# Patient Record
Sex: Male | Born: 1960
Health system: Southern US, Community
[De-identification: ages and names within clinical notes are randomized; demographics above are authoritative.]

## PROBLEM LIST (undated history)

## (undated) DIAGNOSIS — T7840XA Allergy, unspecified, initial encounter: Secondary | ICD-10-CM

## (undated) DIAGNOSIS — R21 Rash and other nonspecific skin eruption: Secondary | ICD-10-CM

## (undated) HISTORY — DX: Allergy, unspecified, initial encounter: T78.40XA

## (undated) HISTORY — DX: Rash and other nonspecific skin eruption: R21

---

## 2012-12-23 ENCOUNTER — Ambulatory Visit: Payer: Self-pay | Admitting: Family Medicine

## 2012-12-23 VITALS — BP 128/74 | HR 63 | Temp 98.1°F | Resp 18 | Ht 65.5 in | Wt 139.4 lb

## 2012-12-23 DIAGNOSIS — L500 Allergic urticaria: Secondary | ICD-10-CM

## 2012-12-23 DIAGNOSIS — R21 Rash and other nonspecific skin eruption: Secondary | ICD-10-CM

## 2012-12-23 MED ORDER — METHYLPREDNISOLONE SODIUM SUCC 125 MG IJ SOLR
125.0000 mg | Freq: Once | INTRAMUSCULAR | Status: AC
  Administered 2012-12-23: 125 mg via INTRAMUSCULAR

## 2012-12-23 MED ORDER — CLOBETASOL PROPIONATE 0.05 % EX CREA: TOPICAL_CREAM | Freq: Two times a day (BID) | CUTANEOUS | Status: DC

## 2012-12-23 MED ORDER — METHYLPREDNISOLONE 4 MG PO KIT: PACK | ORAL | Status: DC

## 2012-12-23 NOTE — Progress Notes (Signed)
[facility]:  Office Visit  Chief Complaint:  Chief Complaint  Patient presents with  . Rash    rt side waist has been see here before for same thing don't know cause    HPI: Kelly Giles is a 52 y.o. male who complains of  1 week history of red itchy rash on his body, started on right waistline. No known triggers that he knows of. He has had this before. He has a history of allergy to shellfish. He ate seafood when this occurs, but it was not specifically shellfish/shrimp when this occurred. He used left over triamcinolone that he had fromlast visit for this. He has been taking allergy medication as well. He has had upper lip swelling today but then went away. He did cut the lawn about 1 week ago but rash started before this. Denies any new foods except fish base products. No new meds, no new detergents/soaps, travels. No insect bites. 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 Itchiness is worse with hot water. Deneis asthma. Denies SOB/CP/vosice changes/swallowing issues.   Past Medical History  Diagnosis Date  . Rash    History reviewed. No pertinent past surgical history. History   Social History  . Marital Status: Married    Spouse Name: N/A    Number of Children: N/A  . Years of Education: N/A   Social History Main Topics  . Smoking status: Never Smoker   . Smokeless tobacco: Never Used  . Alcohol Use: Yes     Comment: social  . Drug Use: No  . Sexually Active: Yes   Other Topics Concern  . None   Social History Narrative  . None   History reviewed. No pertinent family history. No Known Allergies Prior to Admission medications   Not on File     ROS: The patient denies fevers, chills, night sweats, unintentional weight loss, chest pain, palpitations, wheezing, dyspnea on exertion, nausea, vomiting, abdominal pain, dysuria, hematuria, melena, numbness, weakness, or tingling.  All other systems have been  reviewed and were otherwise negative with the exception of those mentioned in the HPI and as above.    PHYSICAL EXAM: Filed Vitals:   12/23/12 1835  BP: 128/74  Pulse: 63  Temp: 98.1 F (36.7 C)  Resp: 18   Filed Vitals:   12/23/12 1835  Height: 5' 5.5" (1.664 m)  Weight: 139 lb 6.4 oz (63.231 kg)   Body mass index is 22.84 kg/(m^2).  General: Alert, no acute distress HEENT:  Normocephalic, atraumatic, oropharynx patent. No exudates. Tm nl. Face is not swollen Cardiovascular:  Regular rate and rhythm, no rubs murmurs or gallops.  No Carotid bruits, radial pulse intact. No pedal edema.  Respiratory: Clear to auscultation bilaterally.  No wheezes, rales, or rhonchi.  No cyanosis, no use of accessory musculature GI: No organomegaly, abdomen is soft and non-tender, positive bowel sounds.  No masses. Skin: + urticarial rash on right waistline, + urticarial rash on legs, chest and also abdomen. Neurologic: Facial musculature symmetric. Psychiatric: Patient is appropriate throughout our interaction. Lymphatic: No cervical lymphadenopathy Musculoskeletal: Gait intact.   LABS: No results found for this or any previous visit.   EKG/XRAY:   Primary read interpreted by Dr. Conley Rolls at [facility].   ASSESSMENT/PLAN: Encounter Diagnoses  Name Primary?  . Allergic urticaria Yes  . Rash and nonspecific skin eruption    Was given Solumedrol 125 mg IM x 1 in office Rx  Medrol dose pack ( language barrier, patient was unable to understand me when trying to explain what he needed to do with PRednisone pills and how to taper so we will just do medrol pack eventhough more expensive.).  Rx Clobetasol for itchiness Take Benadryl 50 mg tonight then 25 mg every 8 hrs prn for itching or until rash resolved Use epi pen prn ( already has at home) Declines allergy testing F/u prn or go to ER prn      Dorsey Authement PHUONG, DO 12/23/2012 8:04 PM

## 2012-12-23 NOTE — Patient Instructions (Signed)
Hives Hives are itchy, red, swollen areas of the skin. They can vary in size and location on your body. Hives can come and go for hours or several days (acute hives) or for several weeks (chronic hives). Hives do not spread from person to person (noncontagious). They may get worse with scratching, exercise, and emotional stress. CAUSES   Allergic reaction to food, additives, or drugs.  Infections, including the common cold.  Illness, such as vasculitis, lupus, or thyroid disease.  Exposure to sunlight, heat, or cold.  Exercise.  Stress.  Contact with chemicals. SYMPTOMS   Red or white swollen patches on the skin. The patches may change size, shape, and location quickly and repeatedly.  Itching.  Swelling of the hands, feet, and face. This may occur if hives develop deeper in the skin. DIAGNOSIS  Your caregiver can usually tell what is wrong by performing a physical exam. Skin or blood tests may also be done to determine the cause of your hives. In some cases, the cause cannot be determined. TREATMENT  Mild cases usually get better with medicines such as antihistamines. Severe cases may require an emergency epinephrine injection. If the cause of your hives is known, treatment includes avoiding that trigger.  HOME CARE INSTRUCTIONS   Avoid causes that trigger your hives.  Take antihistamines as directed by your caregiver to reduce the severity of your hives. Non-sedating or low-sedating antihistamines are usually recommended. Do not drive while taking an antihistamine.  Take any other medicines prescribed for itching as directed by your caregiver.  Wear loose-fitting clothing.  Keep all follow-up appointments as directed by your caregiver. SEEK MEDICAL CARE IF:   You have persistent or severe itching that is not relieved with medicine.  You have painful or swollen joints. SEEK IMMEDIATE MEDICAL CARE IF:   You have a fever.  Your tongue or lips are swollen.  You have  trouble breathing or swallowing.  You feel tightness in the throat or chest.  You have abdominal pain. These problems may be the first sign of a life-threatening allergic reaction. Call your local emergency services (911 in [place] MAKE SURE YOU:   Understand these instructions.  Will watch your condition.  Will get help right away if you are not doing well or get worse. Document Released: 08/27/2005 Document Revised: 02/26/2012 Document Reviewed: 11/20/2011 Exit[employer] Patient Information 2013 [employer].  

## 2013-01-15 ENCOUNTER — Ambulatory Visit (INDEPENDENT_AMBULATORY_CARE_PROVIDER_SITE_OTHER): Payer: BC Managed Care – PPO | Admitting: Emergency Medicine

## 2013-01-15 VITALS — BP 129/70 | HR 64 | Temp 98.6°F | Resp 18 | Ht 66.0 in | Wt 141.0 lb

## 2013-01-15 DIAGNOSIS — L500 Allergic urticaria: Secondary | ICD-10-CM

## 2013-01-15 MED ORDER — PREDNISONE 10 MG PO KIT: PACK | ORAL | Status: DC

## 2013-01-15 NOTE — Progress Notes (Signed)
[facility] [address], [place] [address] [phone]  Date:  01/15/2013   Name:  Kelly Giles   DOB:  09-19-1960   MRN:  [id number]  PCP:  No PCP Per Patient    Chief Complaint: Allergies and Nasal Congestion   History of Present Illness:  Kelly Giles is a 52 y.o. very pleasant male patient who presents with the following:  Was seen on 4/15 for allergic reaction and treated with medrol dose pack and antihistamine.   Resolved for five days following completion of treatment.  Now has recurrent rash after re-exposure to shrimp.  Denies nausea or vomiting, shortness of breath, wheezing or cough.  Has recurrent pruritic rash on extremities.  No improvement with over the counter medications or other home remedies. Denies other complaint or health concern today.   There are no active problems to display for this patient.   Past Medical History  Diagnosis Date  . Rash     No past surgical history on file.  History  Substance Use Topics  . Smoking status: Never Smoker   . Smokeless tobacco: Never Used  . Alcohol Use: Yes     Comment: social    No family history on file.  No Known Allergies  Medication list has been reviewed and updated.  Current Outpatient Prescriptions on File Prior to Visit  Medication Sig Dispense Refill  . clobetasol cream (TEMOVATE) 0.05 % Apply topically 2 (two) times daily. Use only on itchy areas as needed. Do not put on face, do not use for more than 2 weeks.  60 g  1  . methylPREDNISolone (MEDROL, PAK,) 4 MG tablet follow package directions  21 tablet  0   No current facility-administered medications on file prior to visit.    Review of Systems:  As per HPI, otherwise negative.    Physical Examination: Filed Vitals:   01/15/13 2002  BP: 129/70  Pulse: 64  Temp: 98.6 F (37 C)  Resp: 18   Filed Vitals:   01/15/13 2002  Height: 5\' 6"  (1.676 m)  Weight: 141 lb (63.957 kg)   Body mass index is 22.77  kg/(m^2). Ideal Body Weight: Weight in (lb) to have BMI = 25: 154.6   GEN: WDWN, NAD, Non-toxic, Alert & Oriented x 3 HEENT: Atraumatic, Normocephalic.  Ears and Nose: No external deformity. EXTR: No clubbing/cyanosis/edema NEURO: Normal gait.  PSYCH: Normally interactive. Conversant. Not depressed or anxious appearing.  Calm demeanor.  CHEST:  Clear BS= SKIN erythematous eruption on arms.  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 TAC   Signed,  Phillips Odor, MD

## 2013-01-15 NOTE — Patient Instructions (Addendum)
Seafood Allergy   Seafood allergies are usually a life-long problem. People are usually only allergic to one seafood group. Seafood allergy does not increase the risk of iodine allergy. Some conditions (scombroid fish poisoning and [place] allergy) may seem like allergic reactions to seafood, but are separate conditions. Bad reactions may also occur after eating seafood infected or tainted by algae-derived neurotoxins (ciguatera and paralytic shellfish poisoning).  SYMPTOMS   Many allergic reactions to food are mild. Mild symptoms may be limited to hives or swelling in one area. The most dangerous symptoms are:   Breathing difficulties. This may occur from breathing in seafood allergen fumes when food is being cooked or in seafood processing factories.   A drop in blood pressure (shock).   Anaphylaxis is a severe whole body reaction. This is the most severe form of allergic reaction.  Other symptoms include:    Swelling of the face or throat.   Dizziness.   Difficulty thinking.   Intense sense of fear.   Tightness in the chest.   Vomiting.   Diarrhea.  TYPES OF SEAFOOD  There are many types of seafood. The major groups of sea life that trigger allergic reactions are:   VERTEBRATES   Scaly fish (salmon, cod, mackerel, sardines, herring, anchovies, tuna, trout, haddock, [place]).   INVERTEBRATES   Crustaceans (prawns/shrimps, lobster, crab, crayfish, yabbies).   Mollusks.   Shellfish (clams, mussels, oysters, scallops).   Cephalopods (octopus, cuttlefish, squid, calamari).   Gastropods (sea slugs, garden slugs, snails).  As a rule, patients allergic to one group of seafood can usually tolerate those from another. Seafood allergy is most common in communities where seafood is an important part of the diet, such as [place] and [place]. Sensitivity is more common in adults than children.   Occasionally, intense cooking will partially or completely destroy the triggering allergen. This may explain  why some patients allergic to fresh fish are able to tolerate salmon or tuna in a can.  AVOIDING THE ALLERGEN IS AN IMPORTANT PART OF MANAGEMENT.  Complete avoidance of one or more groups of seafood is often advised. It may be difficult to achieve in practice. Accidental exposure is more likely to occur when eating away from home. This is most true when eating at seafood restaurants.  OTHER POTENTIAL SOURCES OF ACCIDENTAL EXPOSURE AND CROSS-CONTAMINATION INCLUDE:   Seafood platters (best avoided).   Asian foods in which shellfish can be a common ingredient or contaminant (prawns in fried rice or soups).   Food may be rolled in the same batter or cooked in the same oil as seafood (take-out fish and chips).   Anchovies (fish) in Caesar salads and as an ingredient, or Worcestershire sauce.   Contaminated barbecues.   Fish extracts are also occasionally used to remove particulate matter from some beverages such as wine and beer. This process is called "fining."  SEAFOOD ALLERGY AND IODINE ALLERGY ARE UNRELATED.  Even though seafood is a rich source of natural iodine, allergic reactions to seafood proteins have a different mechanism to that of iodine. Iodine can be found in topical antiseptics and x-ray contrast agents. Patients allergic to seafood are not at an increased risk of allergic reactions to iodine. Those with iodine allergy are not at increased risk of seafood allergy.  SEEK IMMEDIATE MEDICAL CARE IF:   You have difficulty breathing, or you are wheezing or have a tight feeling in your chest or throat.   You have a swollen mouth, or have hives, swelling or   itching over your body.   You feel faint or pass out.   You develop chest pain or a worsening of the problems which originally caused you to seek medical help.  If you have eaten seafood and develop problems or symptoms that seem unusual for you, seek advice from your caregiver. If the problems are severe, call your local emergency medical  service.  Document Released: 02/16/2002 Document Revised: 11/19/2011 Document Reviewed: 04/09/2008  ExitCare Patient Information 2013 [employer].

## 2013-03-01 ENCOUNTER — Ambulatory Visit (INDEPENDENT_AMBULATORY_CARE_PROVIDER_SITE_OTHER): Payer: BC Managed Care – PPO | Admitting: Emergency Medicine

## 2013-03-01 VITALS — BP 124/84 | HR 56 | Temp 98.3°F | Resp 14 | Ht 66.0 in | Wt 142.0 lb

## 2013-03-01 DIAGNOSIS — L259 Unspecified contact dermatitis, unspecified cause: Secondary | ICD-10-CM

## 2013-03-01 DIAGNOSIS — L509 Urticaria, unspecified: Secondary | ICD-10-CM

## 2013-03-01 MED ORDER — EPINEPHRINE 0.3 MG/0.3ML IJ SOAJ: 0.3000 mg | Freq: Once | INTRAMUSCULAR | Status: AC

## 2013-03-01 MED ORDER — METHYLPREDNISOLONE SODIUM SUCC 125 MG IJ SOLR
125.0000 mg | Freq: Once | INTRAMUSCULAR | Status: AC
  Administered 2013-03-01: 125 mg via INTRAMUSCULAR

## 2013-03-01 NOTE — Patient Instructions (Signed)
Please take Claritin 10 mg one every morning. You can take 1-2 Benadryl at night. Keep your EpiPen with you. Take the prednisone 10 mg 2 tablets a day for 2 days then 1 tablet a day for 2 days then stop. You're being referred to an allergist.Hives Hives are itchy, red, swollen areas of the skin. They can vary in size and location on your body. Hives can come and go for hours or several days (acute hives) or for several weeks (chronic hives). Hives do not spread from person to person (noncontagious). They may get worse with scratching, exercise, and emotional stress. CAUSES   Allergic reaction to food, additives, or drugs.  Infections, including the common cold.  Illness, such as vasculitis, lupus, or thyroid disease.  Exposure to sunlight, heat, or cold.  Exercise.  Stress.  Contact with chemicals. SYMPTOMS   Red or white swollen patches on the skin. The patches may change size, shape, and location quickly and repeatedly.  Itching.  Swelling of the hands, feet, and face. This may occur if hives develop deeper in the skin. DIAGNOSIS  Your caregiver can usually tell what is wrong by performing a physical exam. Skin or blood tests may also be done to determine the cause of your hives. In some cases, the cause cannot be determined. TREATMENT  Mild cases usually get better with medicines such as antihistamines. Severe cases may require an emergency epinephrine injection. If the cause of your hives is known, treatment includes avoiding that trigger.  HOME CARE INSTRUCTIONS   Avoid causes that trigger your hives.  Take antihistamines as directed by your caregiver to reduce the severity of your hives. Non-sedating or low-sedating antihistamines are usually recommended. Do not drive while taking an antihistamine.  Take any other medicines prescribed for itching as directed by your caregiver.  Wear loose-fitting clothing.  Keep all follow-up appointments as directed by your  caregiver. SEEK MEDICAL CARE IF:   You have persistent or severe itching that is not relieved with medicine.  You have painful or swollen joints. SEEK IMMEDIATE MEDICAL CARE IF:   You have a fever.  Your tongue or lips are swollen.  You have trouble breathing or swallowing.  You feel tightness in the throat or chest.  You have abdominal pain. These problems may be the first sign of a life-threatening allergic reaction. Call your local emergency services (911 in [place] MAKE SURE YOU:   Understand these instructions.  Will watch your condition.  Will get help right away if you are not doing well or get worse. Document Released: 08/27/2005 Document Revised: 02/26/2012 Document Reviewed: 11/20/2011 [facility] Patient Information 2014 [place], [place].

## 2013-03-01 NOTE — Progress Notes (Signed)
  Subjective:    Patient ID: Kelly Giles, male    DOB: 02-Jul-1961, 52 y.o.   MRN: [id number]  HPI  52 year old male presents with: rash all over his body since this morning. This rash is reoccurring and started in his mid 48s.His daughter is here with him to help translate due to a language barrier. The rash is itchy. He denies any shortness of breath. He is allergic to shellfish base products. He does dye his hair- last time he dyed his hair was Tuesday 02/24/2013. He has never been to an allergist to see what he is allergic to. He does seem interested in a referral. He would like a shot to relieve the itchiness. He has taken prednisone in the past and it has helped. He has used [facility] Benadryl brand to alleviate itch. Otherwise, he has not used any OTC medication  Review of Systems     Objective:   Physical Exam his throat is clear. His neck is supple. His chest is clear there are no wheezes audible. There is diffuse urticaria over his lower abdomen groin area and upper thighs.        Assessment & Plan:  Patient has recurrent episodes of urticaria. It is unclear what all the triggers are. He did pass hair earlier this week so it could be an allergy to the dye he used. He is known to be allergic to crustaceans. Referral has been made to an allergist for testing. I refilled his EpiPen so he would have that. He is to take Claritin 10 mg one every morning.Marland Kitchen He can also take Benadryl at night. He is to take prednisone 10 mg 2 tablets a day for 2 days then 1 tablet a day for 2 days. He is going to shampoo his hair when he gets home. He is to be given 125 mg of Solu-Medrol IM.

## 2013-03-12 ENCOUNTER — Ambulatory Visit (INDEPENDENT_AMBULATORY_CARE_PROVIDER_SITE_OTHER): Payer: BC Managed Care – PPO | Admitting: Physician Assistant

## 2013-03-12 VITALS — BP 144/88 | HR 51 | Temp 98.5°F | Resp 16 | Ht 66.0 in | Wt 141.0 lb

## 2013-03-12 DIAGNOSIS — L508 Other urticaria: Secondary | ICD-10-CM

## 2013-03-12 MED ORDER — RANITIDINE HCL 150 MG PO TABS: 150.0000 mg | ORAL_TABLET | Freq: Two times a day (BID) | ORAL | Status: DC

## 2013-03-12 MED ORDER — CETIRIZINE HCL 10 MG PO TABS: 10.0000 mg | ORAL_TABLET | Freq: Every day | ORAL | Status: DC

## 2013-03-12 MED ORDER — HYDROXYZINE HCL 25 MG PO TABS: 25.0000 mg | ORAL_TABLET | Freq: Every day | ORAL | Status: DC

## 2013-03-12 NOTE — Progress Notes (Signed)
  Subjective:    Patient ID: Kelly Giles, male    DOB: 02-21-1961, 52 y.o.   MRN: [id number]  HPI   Kelly Giles is a very pleasant 52 yr old male here with concern for a rash that began yesterday.  His daughter accompanies him and helps translate as there is a language barrier.  Pt states that he has had a recurring rash for many years.  The rash only occurs on his back.  This episode began yesterday.  He attributes it to the air conditioning being too cold on the bus.  He states the rash is itchy.  There is no pain.  He has a known shellfish allergy, but denies contact with shellfish.  He denies any other new contacts.  There is no other involvement of any other areas.  Denies associated symptoms.  Of note pt was here two weeks ago for urticaria and just finished a 12 day steroid taper today.     Review of Systems  Constitutional: Negative for fever and chills.  HENT: Negative.   Respiratory: Negative for cough, shortness of breath and wheezing.   Cardiovascular: Negative.   Gastrointestinal: Negative.   Musculoskeletal: Negative.   Skin: Positive for rash.  Neurological: Negative.        Objective:   Physical Exam  Vitals reviewed. Constitutional: He is oriented to person, place, and time. He appears well-developed and well-nourished. No distress.  HENT:  Head: Normocephalic and atraumatic.  Eyes: Conjunctivae are normal. No scleral icterus.  Cardiovascular: Normal rate, regular rhythm and normal heart sounds.   Pulmonary/Chest: Effort normal and breath sounds normal. He has no wheezes. He has no rales.  Abdominal: Soft. There is no tenderness.  Neurological: He is alert and oriented to person, place, and time.  Skin: Skin is warm and dry. Rash noted. Rash is urticarial.     Multiple confluent urticarial lesions covering pt's back; no vesicles; no drainage; multiple excoriations; involvement is confined to the back  Psychiatric: He has a normal mood and affect. His behavior is normal.         Assessment & Plan:  Chronic urticaria - Plan: hydrOXYzine (ATARAX/VISTARIL) 25 MG tablet, cetirizine (ZYRTEC) 10 MG tablet, ranitidine (ZANTAC) 150 MG tablet, Ambulatory referral to Dermatology  Kelly Giles is a very pleasasnt 52 yr old male with what appears to be chronic urticaria.  On chart review, he has been seen here monthly for the last 4 months, each time treated with systemic steroids.  He has just ended a 12 day prednisone taper.  Suspect that he is now having rebound urticaria.  He denies any new exposures and does not have systemic symptoms today.  I think it is best if we avoid further steroid use as this may actually do more harm than good.  Will start him on Zyrtec QAM, ranitidine BID, and hydroxyzine QHS to hopefully control symptoms.  Cool compresses, lotions, topical benadryl, etc for relief.  Pt is scheduled to see allergy on 03/25/13.  I have also put in a referral to dermatology for further input on management.  If any symptoms are acutely worsening prior to specialty eval, pt to RTC.

## 2013-03-12 NOTE — Patient Instructions (Addendum)
Begin taking the Zyrtec (cetirizine) every morning.  Take the Atarax (hydroxyzine) every night at bed time.  Take the Zantac (ranitidine) two times daily - morning at night.  This will help control the itching.  Use topical lotion, cool compresses, topical benadryl to help with itching.  Avoid scratching if possible.  Keep your appointment with allergy this month.  I have also sent a referral to dermatology, so you should be receiving a call about scheduling this.  Please let us know if anything is worsening or not improving.

## 2013-04-19 ENCOUNTER — Ambulatory Visit (INDEPENDENT_AMBULATORY_CARE_PROVIDER_SITE_OTHER): Payer: BC Managed Care – PPO | Admitting: Emergency Medicine

## 2013-04-19 VITALS — BP 170/108 | HR 63 | Temp 98.0°F | Resp 16 | Ht 66.0 in | Wt 144.0 lb

## 2013-04-19 DIAGNOSIS — L508 Other urticaria: Secondary | ICD-10-CM

## 2013-04-19 MED ORDER — HYDROXYZINE HCL 25 MG PO TABS: 25.0000 mg | ORAL_TABLET | Freq: Every day | ORAL | Status: DC

## 2013-04-19 MED ORDER — CYPROHEPTADINE HCL 4 MG PO TABS: 4.0000 mg | ORAL_TABLET | Freq: Three times a day (TID) | ORAL | Status: DC | PRN

## 2013-04-19 NOTE — Patient Instructions (Addendum)
  Take Zyrtec daily in morning Hydroxyzine 2 at bedtime Ranitidine in morning only Cyproheptadine three times daily    Hives Hives are itchy, red, swollen areas of the skin. They can vary in size and location on your body. Hives can come and go for hours or several days (acute hives) or for several weeks (chronic hives). Hives do not spread from person to person (noncontagious). They may get worse with scratching, exercise, and emotional stress. CAUSES   Allergic reaction to food, additives, or drugs.  Infections, including the common cold.  Illness, such as vasculitis, lupus, or thyroid disease.  Exposure to sunlight, heat, or cold.  Exercise.  Stress.  Contact with chemicals. SYMPTOMS   Red or white swollen patches on the skin. The patches may change size, shape, and location quickly and repeatedly.  Itching.  Swelling of the hands, feet, and face. This may occur if hives develop deeper in the skin. DIAGNOSIS  Your caregiver can usually tell what is wrong by performing a physical exam. Skin or blood tests may also be done to determine the cause of your hives. In some cases, the cause cannot be determined. TREATMENT  Mild cases usually get better with medicines such as antihistamines. Severe cases may require an emergency epinephrine injection. If the cause of your hives is known, treatment includes avoiding that trigger.  HOME CARE INSTRUCTIONS   Avoid causes that trigger your hives.  Take antihistamines as directed by your caregiver to reduce the severity of your hives. Non-sedating or low-sedating antihistamines are usually recommended. Do not drive while taking an antihistamine.  Take any other medicines prescribed for itching as directed by your caregiver.  Wear loose-fitting clothing.  Keep all follow-up appointments as directed by your caregiver. SEEK MEDICAL CARE IF:   You have persistent or severe itching that is not relieved with medicine.  You have  painful or swollen joints. SEEK IMMEDIATE MEDICAL CARE IF:   You have a fever.  Your tongue or lips are swollen.  You have trouble breathing or swallowing.  You feel tightness in the throat or chest.  You have abdominal pain. These problems may be the first sign of a life-threatening allergic reaction. Call your local emergency services (911 in [place] MAKE SURE YOU:   Understand these instructions.  Will watch your condition.  Will get help right away if you are not doing well or get worse. Document Released: 08/27/2005 Document Revised: 02/26/2012 Document Reviewed: 11/20/2011 [facility] Patient Information 2014 [place], [place].

## 2013-04-19 NOTE — Progress Notes (Signed)
[facility] [address] [place] [address] [phone]  Date:  04/19/2013   Name:  Kelly Giles   DOB:  01-08-1961   MRN:  [id number]  PCP:  No PCP Per Patient    Chief Complaint: Follow-up  Daughter is translator  History of Present Illness:  Kelly Giles is a 52 y.o. 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  Taking medication and has transient improvement.  Now wants another prednisone pack or a shot  Awaiting dermatology appt.    Patient Active Problem List   Diagnosis Date Noted  . Hives 03/01/2013    Past Medical History  Diagnosis Date  . Rash   . Allergy     History reviewed. No pertinent past surgical history.  History  Substance Use Topics  . Smoking status: Never Smoker   . Smokeless tobacco: Never Used  . Alcohol Use: Yes     Comment: social    History reviewed. No pertinent family history.  Allergies  Allergen Reactions  . Shellfish Allergy     Medication list has been reviewed and updated.  Current Outpatient Prescriptions on File Prior to Visit  Medication Sig Dispense Refill  . cetirizine (ZYRTEC) 10 MG tablet Take 1 tablet (10 mg total) by mouth daily.  30 tablet  11  . EPINEPHrine (EPI-PEN) 0.3 mg/0.3 mL DEVI Inject 0.3 mLs (0.3 mg total) into the muscle once.  1 Device  2  . hydrOXYzine (ATARAX/VISTARIL) 25 MG tablet Take 1 tablet (25 mg total) by mouth at bedtime.  30 tablet  11  . ranitidine (ZANTAC) 150 MG tablet Take 1 tablet (150 mg total) by mouth 2 (two) times daily.  60 tablet  11  . clobetasol cream (TEMOVATE) 0.05 % Apply topically 2 (two) times daily. Use only on itchy areas as needed. Do not put on face, do not use for more than 2 weeks.  60 g  1   No current facility-administered medications on file prior to visit.    Review of Systems:  As per HPI, otherwise negative.    Physical  Examination: Filed Vitals:   04/19/13 1109  BP: 170/108  Pulse: 63  Temp: 98 F (36.7 C)  Resp: 16   Filed Vitals:   04/19/13 1109  Height: 5\' 6"  (1.676 m)  Weight: 144 lb (65.318 kg)   Body mass index is 23.25 kg/(m^2). Ideal Body Weight: Weight in (lb) to have BMI = 25: 154.6  GEN: WDWN, NAD, Non-toxic, A & O x 3 HEENT: Atraumatic, Normocephalic. Neck supple. No masses, No LAD. Ears and Nose: No external deformity. CV: RRR, No M/G/R. No JVD. No thrill. No extra heart sounds. PULM: CTA B, no wheezes, crackles, rhonchi. No retractions. No resp. distress. No accessory muscle use. ABD: S, NT, ND, +BS. No rebound. No HSM. EXTR: No c/c/e NEURO Normal gait.  PSYCH: Normally interactive.  Not depressed or anxious appearing.  Calm demeanor.  SKIN;  urticaria  Assessment and Plan: Urticaria  Signed,  Phillips Odor, MD

## 2017-01-31 ENCOUNTER — Ambulatory Visit (INDEPENDENT_AMBULATORY_CARE_PROVIDER_SITE_OTHER): Payer: BLUE CROSS/BLUE SHIELD | Admitting: Physician Assistant

## 2017-01-31 ENCOUNTER — Ambulatory Visit (INDEPENDENT_AMBULATORY_CARE_PROVIDER_SITE_OTHER): Payer: BLUE CROSS/BLUE SHIELD

## 2017-01-31 ENCOUNTER — Encounter: Payer: Self-pay | Admitting: Physician Assistant

## 2017-01-31 ENCOUNTER — Encounter: Payer: Self-pay | Admitting: Family Medicine

## 2017-01-31 VITALS — BP 160/74 | HR 53 | Temp 97.8°F | Resp 18 | Ht 65.0 in | Wt 142.0 lb

## 2017-01-31 DIAGNOSIS — I10 Essential (primary) hypertension: Secondary | ICD-10-CM

## 2017-01-31 DIAGNOSIS — Z201 Contact with and (suspected) exposure to tuberculosis: Secondary | ICD-10-CM | POA: Diagnosis not present

## 2017-01-31 DIAGNOSIS — R911 Solitary pulmonary nodule: Secondary | ICD-10-CM | POA: Diagnosis not present

## 2017-01-31 MED ORDER — AMLODIPINE BESYLATE 5 MG PO TABS: 5.0000 mg | ORAL_TABLET | Freq: Every day | ORAL | 3 refills | Status: AC

## 2017-01-31 NOTE — Patient Instructions (Addendum)
Please sign a consent for release of information so that we can request the records from your previous provider.  Please schedule a wellness visit and establish with a provider for primary care at your convenience.  The imaging facility will contact you to schedule the chest CT.    IF you received an x-ray today, you will receive an invoice from [facility]. Please contact [facility] at [phone] with questions or concerns regarding your invoice.   IF you received labwork today, you will receive an invoice from [facility]. Please contact [facility] at [phone] with questions or concerns regarding your invoice.   Our billing staff will not be able to assist you with questions regarding bills from these companies.  You will be contacted with the lab results as soon as they are available. The fastest way to get your results is to activate your My Chart account. Instructions are located on the last page of this paperwork. If you have not heard from us regarding the results in 2 weeks, please contact this office.

## 2017-01-31 NOTE — Progress Notes (Signed)
Patient ID: Kelly Giles, male     DOB: 1960/09/21, 56 y.o.    MRN: [id number]  PCP: Patient, No Pcp Per  Chief Complaint  Patient presents with  . Annual Exam    CXR, Possible TB (Pt's Wife has TB) no symptoms    Subjective:   This patient is new to me and presents for evaluation of TB exposure.  His wife was diagnosed 2-3 months ago, during evaluation of a cough. She has been on TB treatment through the [facility] x 2 months. He was advised to seek evaluation as well, as he is a household/intimate contact.  He is asymptomatic. No cough, fever, night sweats, weight loss.   Review of Systems No chest pain, SOB, HA, dizziness, vision change, N/V, diarrhea, constipation, dysuria, urinary urgency or frequency, myalgias, arthralgias or rash.   Prior to Admission medications   Medication Sig Start Date End Date Taking? Authorizing Provider  EPINEPHrine (EPI-PEN) 0.3 mg/0.3 mL DEVI Inject 0.3 mLs (0.3 mg total) into the muscle once. Patient not taking: Reported on 01/31/2017 03/01/13   Collene Gobbleaub, Steven A, MD  hydrOXYzine (ATARAX/VISTARIL) 25 MG tablet  12/02/16   [provider]     Allergies  Allergen Reactions  . Shellfish Allergy      Patient Active Problem List   Diagnosis Date Noted  . Hives 03/01/2013     No family history on file.   Social History   Social History  . Marital status: Married    Spouse name: Myriam JacobsonHelen  . Number of children: 2  . Years of education: 3rd grade   Occupational History  . Curatormechanic     heavy engines   Social History Main Topics  . Smoking status: Never Smoker  . Smokeless tobacco: Never Used  . Alcohol use Yes     Comment: social  . Drug use: No  . Sexual activity: Yes    Partners: Female   Other Topics Concern  . Not on file   Social History Narrative   From [place]. Came to the [place] in 1993.   Lives with his wife and their daughter, who attends [employer].   Son lives in [place].         Objective:  Physical Exam    Constitutional: He is oriented to person, place, and time. He appears well-developed and well-nourished. He is active and cooperative. No distress.  BP (!) 160/74   Pulse (!) 53   Temp 97.8 F (36.6 C) (Oral)   Resp 18   Ht 5\' 5"  (1.651 m)   Wt 142 lb (64.4 kg)   SpO2 98%   BMI 23.63 kg/m   HENT:  Head: Normocephalic and atraumatic.  Right Ear: Hearing normal.  Left Ear: Hearing normal.  Eyes: Conjunctivae are normal. No scleral icterus.  Neck: Normal range of motion. Neck supple. No thyromegaly present.  Cardiovascular: Normal rate, regular rhythm and normal heart sounds.   Pulses:      Radial pulses are 2+ on the right side, and 2+ on the left side.  Pulmonary/Chest: Effort normal and breath sounds normal.  Lymphadenopathy:       Head (right side): No tonsillar, no preauricular, no posterior auricular and no occipital adenopathy present.       Head (left side): No tonsillar, no preauricular, no posterior auricular and no occipital adenopathy present.    He has no cervical adenopathy.       Right: No supraclavicular adenopathy present.  Left: No supraclavicular adenopathy present.  Neurological: He is alert and oriented to person, place, and time. No sensory deficit.  Skin: Skin is warm, dry and intact. No rash noted. No cyanosis or erythema. Nails show no clubbing.  Psychiatric: He has a normal mood and affect. His speech is normal and behavior is normal.    Dg Chest 2 View  Result Date: 01/31/2017 CLINICAL DATA:  Evaluate possible left lower lobe pulmonary nodule. EXAM: CHEST  2 VIEW COMPARISON:  Earlier film, same date. FINDINGS: The repeat chest film with nipple markers demonstrates that the left lower lobe pulmonarya nodule is not the left nipple. Recommend chest CT with contrast for further evaluation. IMPRESSION: Left lower lobe pulmonary nodule. Recommend chest CT for further evaluation. Electronically Signed   By: Rudie Meyer M.D.   On: 01/31/2017 10:18   Dg  Chest 2 View  Result Date: 01/31/2017 CLINICAL DATA:  Exposure to TB. EXAM: CHEST  2 VIEW COMPARISON:  None. FINDINGS: No pneumothorax. The heart, hila, and mediastinum are normal. The right lung is clear. A nodule projected over the lateral left lung base could represent a nipple shadow. No other acute abnormalities are seen within the chest. IMPRESSION: A nodule projected over the lateral left lung base could represent a nipple shadow. Recommend repeat imaging with nipple markers. Electronically Signed   By: Gerome Sam III M.D   On: 01/31/2017 09:54       Assessment & Plan:   Problem List Items Addressed This Visit    Lung nodule   Relevant Orders   CT Chest W Contrast   Exposure to TB - Primary   Relevant Orders   DG Chest 2 View (Completed)   TB Skin Test (Completed)   DG Chest 2 View (Completed)   Care order/instruction: (Completed)   Care order/instruction: (Completed)   CT Chest W Contrast   Benign essential HTN    Uncontrolled off medication. Will request previous records. Start amlodipine 5 mg.      Relevant Medications   amLODipine (NORVASC) 5 MG tablet       Return in about 6 weeks (around 03/14/2017) for for BP recheck and wellness visit, return in 48-72 hours for the skin test reading.   Fernande Bras, PA-C Primary Care at [facility]

## 2017-01-31 NOTE — Assessment & Plan Note (Addendum)
Uncontrolled off medication. Will request previous records. Start amlodipine 5 mg.

## 2017-01-31 NOTE — Plan of Care (Signed)
  Tuberculosis Risk Questionnaire  1. Yes  Were you born outside the [place] in one of the following parts of the world: [place], [place], [place], [place] or [place]?    2. Yes  Have you traveled outside the [place] and lived for more than one month in one of the following parts of the world: [place], [place], [place], [place] or [place]?    3. No Do you have a compromised immune system such as from any of the following conditions:HIV/AIDS, organ or bone marrow transplantation, diabetes, immunosuppressive medicines (e.g. Prednisone, Remicaide), leukemia, lymphoma, cancer of the head or neck, gastrectomy or jejunal bypass, end-stage renal disease (on dialysis), or silicosis?     4. No Have you ever or do you plan on working in: a residential care center, a health care facility, a jail or prison or homeless shelter?    5. No Have you ever: injected illegal drugs, used crack cocaine, lived in a homeless shelter  or been in jail or prison?     6. Yes  Have you ever been exposed to anyone with infectious tuberculosis?    Tuberculosis Symptom Questionnaire  Do you currently have any of the following symptoms?  1. No Unexplained cough lasting more than 3 weeks?   2. No Unexplained fever lasting more than 3 weeks.   3. No Night Sweats (sweating that leaves the bedclothes and sheets wet)     4. No Shortness of Breath   5. No Chest Pain   6. No Unintentional weight loss    7. No Unexplained fatigue (very tired for no reason)

## 2017-02-02 ENCOUNTER — Encounter: Payer: Self-pay | Admitting: Family Medicine

## 2017-02-02 ENCOUNTER — Ambulatory Visit (INDEPENDENT_AMBULATORY_CARE_PROVIDER_SITE_OTHER): Payer: BLUE CROSS/BLUE SHIELD | Admitting: Family Medicine

## 2017-02-02 VITALS — BP 144/88 | HR 73 | Temp 98.8°F | Resp 14 | Ht 65.0 in | Wt 142.0 lb

## 2017-02-02 DIAGNOSIS — R7611 Nonspecific reaction to tuberculin skin test without active tuberculosis: Secondary | ICD-10-CM | POA: Diagnosis not present

## 2017-02-02 DIAGNOSIS — Z201 Contact with and (suspected) exposure to tuberculosis: Secondary | ICD-10-CM | POA: Diagnosis not present

## 2017-02-02 DIAGNOSIS — Z5181 Encounter for therapeutic drug level monitoring: Secondary | ICD-10-CM | POA: Diagnosis not present

## 2017-02-02 LAB — POCT CBC
Granulocyte percent: 58.7 %G (ref 37–80)
HCT, POC: 46 % (ref 43.5–53.7)
Hemoglobin: 15.6 g/dL (ref 14.1–18.1)
Lymph, poc: 2 (ref 0.6–3.4)
MCH, POC: 26.5 pg — AB (ref 27–31.2)
MCHC: 33.9 g/dL (ref 31.8–35.4)
MCV: 78 fL — AB (ref 80–97)
MID (cbc): 0.1 (ref 0–0.9)
MPV: 8.7 fL (ref 0–99.8)
PLATELET COUNT, POC: 203 10*3/uL (ref 142–424)
POC Granulocyte: 3.1 (ref 2–6.9)
POC LYMPH %: 38.6 % (ref 10–50)
POC MID %: 2.7 %M (ref 0–12)
RBC: 5.89 M/uL (ref 4.69–6.13)
RDW, POC: 13 %
WBC: 5.3 10*3/uL (ref 4.6–10.2)

## 2017-02-02 LAB — TB SKIN TEST: TB Skin Test: POSITIVE

## 2017-02-02 NOTE — Progress Notes (Signed)
Chief Complaint  Patient presents with  . PPD Reading    has induration +PPD    HPI   Patients daughter is translating over the phone He does not understand if he has tb or not He states that his wife is being treated at the health department.  He [place] of the [place]  His daughter was tested and was negative They did not want to get any treatment unless the test was confirmed.  He denies cough He denies fevers or night sweats.    Past Medical History:  Diagnosis Date  . Allergy   . Rash     Current Outpatient Prescriptions  Medication Sig Dispense Refill  . amLODipine (NORVASC) 5 MG tablet Take 1 tablet (5 mg total) by mouth daily. 90 tablet 3  . hydrOXYzine (ATARAX/VISTARIL) 25 MG tablet   0  . EPINEPHrine (EPI-PEN) 0.3 mg/0.3 mL DEVI Inject 0.3 mLs (0.3 mg total) into the muscle once. (Patient not taking: Reported on 01/31/2017) 1 Device 2   No current facility-administered medications for this visit.     Allergies:  Allergies  Allergen Reactions  . Shellfish Allergy     No past surgical history on file.  Social History   Social History  . Marital status: Married    Spouse name: Kelly Giles  . Number of children: 2  . Years of education: 3rd grade   Occupational History  . Curatormechanic     heavy engines   Social History Main Topics  . Smoking status: Never Smoker  . Smokeless tobacco: Never Used  . Alcohol use Yes     Comment: social  . Drug use: No  . Sexual activity: Yes    Partners: Female   Other Topics Concern  . None   Social History Narrative   From [place]. Came [place] the [place] in [place].   Lives with his wife and their daughter, who attends [employer].   Son lives in [place].    ROS See hpi  Objective: Vitals:   02/02/17 0940  BP: (!) 144/88  Pulse: 73  Resp: 14  Temp: 98.8 F (37.1 C)  Weight: 142 lb (64.4 kg)  Height: 5\' 5"  (1.651 m)    Physical Exam  Constitutional: He appears well-developed and well-nourished.  Cardiovascular: Normal  rate, regular rhythm and normal heart sounds.   Pulmonary/Chest: Effort normal and breath sounds normal. No respiratory distress. He has no wheezes.  Skin: Skin is warm.  Induration 30mm    Assessment and Plan Kelly Giles was seen today for ppd reading.  Diagnoses and all orders for this visit:  Exposure to TB -     Quantiferon tb gold assay -     POCT CBC -     Comprehensive metabolic panel -     HIV antibody -     Ambulatory referral to Infectious Disease  Positive PPD -     POCT CBC -     Comprehensive metabolic panel -     Ambulatory referral to Infectious Disease  Medication monitoring encounter -     Comprehensive metabolic panel  Spent time speaking to patient in office, his wife on the phone and his daughter. Discussed the patient with Dr. Gwendolyn GrantWalden. Discussed referral to Health Dept or Infectious Disease Will check for risk factors as well as check liver enzymes so that if treatment is necessary it can be initiated  Will check quantiferon gold since pt may have a false positive  A total of 25 minutes were  spent face-to-face with the patient during this encounter and over half of that time was spent on counseling and coordination of care.   Kelly Giles

## 2017-02-03 LAB — COMPREHENSIVE METABOLIC PANEL
ALBUMIN: 4.4 g/dL (ref 3.5–5.5)
ALK PHOS: 79 IU/L (ref 39–117)
ALT: 25 IU/L (ref 0–44)
AST: 18 IU/L (ref 0–40)
Albumin/Globulin Ratio: 1.5 (ref 1.2–2.2)
BUN / CREAT RATIO: 15 (ref 9–20)
BUN: 13 mg/dL (ref 6–24)
Bilirubin Total: 0.6 mg/dL (ref 0.0–1.2)
CO2: 22 mmol/L (ref 18–29)
CREATININE: 0.86 mg/dL (ref 0.76–1.27)
Calcium: 9.7 mg/dL (ref 8.7–10.2)
Chloride: 101 mmol/L (ref 96–106)
GFR calc Af Amer: 114 mL/min/{1.73_m2} (ref >59)
GFR calc non Af Amer: 99 mL/min/{1.73_m2} (ref >59)
GLUCOSE: 130 mg/dL — AB (ref 65–99)
Globulin, Total: 3 g/dL (ref 1.5–4.5)
Potassium: 4.5 mmol/L (ref 3.5–5.2)
Sodium: 140 mmol/L (ref 134–144)
Total Protein: 7.4 g/dL (ref 6.0–8.5)

## 2017-02-03 LAB — HIV ANTIBODY (ROUTINE TESTING W REFLEX): HIV SCREEN 4TH GENERATION: NONREACTIVE

## 2017-02-06 ENCOUNTER — Encounter: Payer: Self-pay | Admitting: Family Medicine

## 2017-02-06 ENCOUNTER — Telehealth: Payer: Self-pay | Admitting: Family Medicine

## 2017-02-06 LAB — QUANTIFERON TB GOLD ASSAY (BLOOD)

## 2017-02-06 LAB — QUANTIFERON IN TUBE
QFT TB AG MINUS NIL VALUE: 7.5 IU/mL
QUANTIFERON MITOGEN VALUE: 6.94 [IU]/mL
QUANTIFERON TB AG VALUE: 7.64 [IU]/mL
QUANTIFERON TB GOLD: POSITIVE — AB
Quantiferon Nil Value: 0.14 IU/mL

## 2017-02-06 NOTE — Telephone Encounter (Signed)
Spoke to [employer] about patient's positive quantiferon gold and current labs She already has plan to bring records to the health department.

## 2017-02-08 ENCOUNTER — Telehealth: Payer: Self-pay | Admitting: Physician Assistant

## 2017-02-08 NOTE — Telephone Encounter (Signed)
PATIENT'S WIFE (HUI) SAID SOMEONE TRIED TO CALL HER CELL PHONE TODAY (02/08/17) ABOUT 2:15 pm BUT THEY DID NOT LEAVE A MESSAGE. SHE IS RETURNING OUR CALL. [facility] [phone] (CELL) PATIENT'S WIFE IS HUI AND SHE IS ON HIS 2018 HIPAA. HE DOES NOT SPEAK ENGLISH.  MBC

## 2017-02-09 NOTE — Telephone Encounter (Signed)
Wife has already seen in my chart and confirms she will let health dept. know

## 2017-02-11 ENCOUNTER — Other Ambulatory Visit: Payer: Self-pay | Admitting: Physician Assistant

## 2017-02-14 ENCOUNTER — Inpatient Hospital Stay: Admission: RE | Admit: 2017-02-14 | Payer: Self-pay | Source: Ambulatory Visit

## 2017-02-19 ENCOUNTER — Ambulatory Visit
Admission: RE | Admit: 2017-02-19 | Discharge: 2017-02-19 | Disposition: A | Discharge status: Home / Self Care | Payer: BLUE CROSS/BLUE SHIELD | Source: Ambulatory Visit | Attending: Physician Assistant | Admitting: Physician Assistant

## 2017-02-19 DIAGNOSIS — R911 Solitary pulmonary nodule: Secondary | ICD-10-CM

## 2017-02-19 DIAGNOSIS — Z201 Contact with and (suspected) exposure to tuberculosis: Secondary | ICD-10-CM

## 2017-02-19 MED ORDER — IOPAMIDOL (ISOVUE-300) INJECTION 61%
75.0000 mL | Freq: Once | INTRAVENOUS | Status: AC | PRN
  Administered 2017-02-19: 75 mL via INTRAVENOUS

## 2017-09-11 ENCOUNTER — Ambulatory Visit: Payer: BLUE CROSS/BLUE SHIELD | Admitting: Physician Assistant

## 2017-09-13 ENCOUNTER — Encounter: Payer: Self-pay | Admitting: Physician Assistant

## 2017-09-13 ENCOUNTER — Ambulatory Visit: Payer: BLUE CROSS/BLUE SHIELD | Admitting: Physician Assistant

## 2017-09-13 ENCOUNTER — Telehealth: Payer: Self-pay | Admitting: Physician Assistant

## 2017-09-13 ENCOUNTER — Other Ambulatory Visit: Payer: Self-pay

## 2017-09-13 VITALS — BP 118/70 | HR 61 | Temp 98.6°F | Resp 18 | Ht 65.0 in | Wt 144.4 lb

## 2017-09-13 DIAGNOSIS — R911 Solitary pulmonary nodule: Secondary | ICD-10-CM

## 2017-09-13 DIAGNOSIS — Z1159 Encounter for screening for other viral diseases: Secondary | ICD-10-CM

## 2017-09-13 DIAGNOSIS — I700 Atherosclerosis of aorta: Secondary | ICD-10-CM | POA: Diagnosis not present

## 2017-09-13 DIAGNOSIS — Z23 Encounter for immunization: Secondary | ICD-10-CM | POA: Diagnosis not present

## 2017-09-13 DIAGNOSIS — K760 Fatty (change of) liver, not elsewhere classified: Secondary | ICD-10-CM | POA: Diagnosis not present

## 2017-09-13 NOTE — Assessment & Plan Note (Signed)
Update non-contract CT scan as recommended.

## 2017-09-13 NOTE — Telephone Encounter (Signed)
Pt CT CHEST WO CONTRAST was denied through insurance.. You can do a peer to peer to add additional clinical information at [phone]..  Thank you.

## 2017-09-13 NOTE — Patient Instructions (Signed)
     IF you received an x-ray today, you will receive an invoice from [facility]. Please contact [facility] at [phone] with questions or concerns regarding your invoice.   IF you received labwork today, you will receive an invoice from [facility]. Please contact [facility] at [phone] with questions or concerns regarding your invoice.   Our billing staff will not be able to assist you with questions regarding bills from these companies.  You will be contacted with the lab results as soon as they are available. The fastest way to get your results is to activate your My Chart account. Instructions are located on the last page of this paperwork. If you have not heard from us regarding the results in 2 weeks, please contact this office.     

## 2017-09-13 NOTE — Assessment & Plan Note (Signed)
Lipids today. BP is well controlled on current treatment with amlodipine.

## 2017-09-13 NOTE — Progress Notes (Signed)
   Patient ID: Rhythm Y Banker, male    DOB: 11/13/1960, 56 y.o.   MRN: [id number]  PCP: Patient, No Pcp Per  Chief Complaint  Patient presents with  . Follow-up    on TB     Subjective:   Presents for evaluation of lung nodule. He is accompanied by his wife and their daughter, who translates.  I met him in 01/2017 for evaluation after his wife was diagnosed with TB. CXR at that visit revealed a nodule in the LEFT lower lobe. CT scan revealed an 8 mm LEFT lower lobe nodule, and repeat imaging with non-contrast CT recommended in 6-12 months.  He has completed treatment for TB exposure. He feels well, and has no symptoms of active TB.  Review of Systems No chest pain, SOB, HA, dizziness, vision change, N/V, diarrhea, constipation, dysuria, urinary urgency or frequency, myalgias, arthralgias or rash. No melena/hematochezia.   Patient Active Problem List   Diagnosis Date Noted  . Atherosclerosis of aorta (HCC) 09/13/2017  . Hepatic steatosis 09/13/2017  . Lung nodule 01/31/2017  . Exposure to TB 01/31/2017  . Benign essential HTN 01/31/2017  . Hives 03/01/2013     Prior to Admission medications   Medication Sig Start Date End Date Taking? Authorizing Provider  amLODipine (NORVASC) 5 MG tablet Take 1 tablet (5 mg total) by mouth daily. 01/31/17  Yes Jeffery, Chelle, PA-C  EPINEPHrine (EPI-PEN) 0.3 mg/0.3 mL DEVI Inject 0.3 mLs (0.3 mg total) into the muscle once. 03/01/13  Yes Daub, Steven A, MD  hydrOXYzine (ATARAX/VISTARIL) 25 MG tablet  12/02/16  Yes [provider]     Allergies  Allergen Reactions  . Shellfish Allergy        Objective:  Physical Exam  Constitutional: He is oriented to person, place, and time. He appears well-developed and well-nourished. He is active and cooperative. No distress.  BP 118/70   Pulse 61   Temp 98.6 F (37 C) (Oral)   Resp 18   Ht 5' 5" (1.651 m)   Wt 144 lb 6.4 oz (65.5 kg)   SpO2 98%   BMI 24.03 kg/m   HENT:  Head:  Normocephalic and atraumatic.  Right Ear: Hearing normal.  Left Ear: Hearing normal.  Eyes: Conjunctivae are normal. No scleral icterus.  Neck: Normal range of motion. Neck supple. No thyromegaly present.  Cardiovascular: Normal rate, regular rhythm and normal heart sounds.  Pulses:      Radial pulses are 2+ on the right side, and 2+ on the left side.  Pulmonary/Chest: Effort normal and breath sounds normal.  Lymphadenopathy:       Head (right side): No tonsillar, no preauricular, no posterior auricular and no occipital adenopathy present.       Head (left side): No tonsillar, no preauricular, no posterior auricular and no occipital adenopathy present.    He has no cervical adenopathy.       Right: No supraclavicular adenopathy present.       Left: No supraclavicular adenopathy present.  Neurological: He is alert and oriented to person, place, and time. No sensory deficit.  Skin: Skin is warm, dry and intact. No rash noted. No cyanosis or erythema. Nails show no clubbing.  Psychiatric: He has a normal mood and affect. His speech is normal and behavior is normal.           Assessment & Plan:   Problem List Items Addressed This Visit    Lung nodule    Update non-contract CT   scan as recommended.      Relevant Orders   CT Chest Wo Contrast   Atherosclerosis of aorta (HCC)    Lipids today. BP is well controlled on current treatment with amlodipine.      Relevant Orders   Lipid panel   Hepatic steatosis    Lipids and CMET today.      Relevant Orders   Comprehensive metabolic panel   Lipid panel    Other Visit Diagnoses    Need for influenza vaccination    -  Primary   Relevant Orders   Flu Vaccine QUAD 36+ mos IM (Completed)   Need for Tdap vaccination       Relevant Orders   Tdap vaccine greater than or equal to 7yo IM (Completed)   Need for hepatitis C screening test       Relevant Orders   Hepatitis C antibody       Return in about 6 months (around  03/13/2018) for re-evalaution of lung nodule, cholesterol.   Fara Chute, PA-C Primary Care at [place]

## 2017-09-13 NOTE — Assessment & Plan Note (Signed)
Lipids and CMET today.

## 2017-09-14 LAB — LIPID PANEL
CHOLESTEROL TOTAL: 189 mg/dL (ref 100–199)
Chol/HDL Ratio: 3.7 ratio (ref 0.0–5.0)
HDL: 51 mg/dL (ref >39)
LDL CALC: 113 mg/dL — AB (ref 0–99)
TRIGLYCERIDES: 123 mg/dL (ref 0–149)
VLDL Cholesterol Cal: 25 mg/dL (ref 5–40)

## 2017-09-14 LAB — COMPREHENSIVE METABOLIC PANEL
ALK PHOS: 81 IU/L (ref 39–117)
ALT: 26 IU/L (ref 0–44)
AST: 21 IU/L (ref 0–40)
Albumin/Globulin Ratio: 1.5 (ref 1.2–2.2)
Albumin: 4.3 g/dL (ref 3.5–5.5)
BILIRUBIN TOTAL: 0.6 mg/dL (ref 0.0–1.2)
BUN/Creatinine Ratio: 18 (ref 9–20)
BUN: 14 mg/dL (ref 6–24)
CHLORIDE: 103 mmol/L (ref 96–106)
CO2: 20 mmol/L (ref 20–29)
CREATININE: 0.77 mg/dL (ref 0.76–1.27)
Calcium: 9.5 mg/dL (ref 8.7–10.2)
GFR calc Af Amer: 119 mL/min/{1.73_m2} (ref >59)
GFR calc non Af Amer: 103 mL/min/{1.73_m2} (ref >59)
GLUCOSE: 128 mg/dL — AB (ref 65–99)
Globulin, Total: 2.8 g/dL (ref 1.5–4.5)
Potassium: 4.6 mmol/L (ref 3.5–5.2)
SODIUM: 140 mmol/L (ref 134–144)
Total Protein: 7.1 g/dL (ref 6.0–8.5)

## 2017-09-14 LAB — HEPATITIS C ANTIBODY: Hep C Virus Ab: <0.1 s/co ratio (ref 0.0–0.9)

## 2017-09-18 NOTE — Telephone Encounter (Signed)
Called to perform peer-to-peer/provide additional clinical information.  Closed case, not authorized/denied. Information was provided that the nodule is calcified, and otherwise incomplete. The lesion is NOT calcified, and follow-up imaging is recommended.  Advised that the patient's insurance plan needs to be contacted, not AIM. Also consider e-faxing notes in the future.

## 2017-09-19 ENCOUNTER — Telehealth: Payer: Self-pay | Admitting: Physician Assistant

## 2017-09-19 NOTE — Telephone Encounter (Signed)
Thank you :)

## 2017-09-19 NOTE — Telephone Encounter (Signed)
This patient's case was addressed through referrals.  I spoke to PonderosaGloria about patient and she is going to address the issues per Chelle's note today.  I sent a message to Chelle making her aware of what was happening.

## 2017-09-19 NOTE — Telephone Encounter (Signed)
Called to see if I could resend clinical notes, unfortunately once a peer to peer is done the next step is to file an appeal which I sent the notes to start the appeal process to the appeal office at fax: [phone]

## 2017-09-20 NOTE — Telephone Encounter (Signed)
erica called from the appeals dept and stated that the CT has been approved and the auth# : [id number] and its valid from 09/20/2017-10/19/2017 it will be done a [place] imaging

## 2017-10-04 ENCOUNTER — Ambulatory Visit
Admission: RE | Admit: 2017-10-04 | Discharge: 2017-10-04 | Disposition: A | Discharge status: Home / Self Care | Payer: BLUE CROSS/BLUE SHIELD | Source: Ambulatory Visit | Attending: Physician Assistant | Admitting: Physician Assistant

## 2017-10-04 DIAGNOSIS — R911 Solitary pulmonary nodule: Secondary | ICD-10-CM

## 2019-10-01 IMAGING — CT CT CHEST W/O CM
1 of 4 series · 12 of 31 positions shown, 15 images · non-contrast
Comparison: 02/20/2015

CLINICAL DATA: Pulmonary nodule.

EXAM:
CT CHEST WITHOUT CONTRAST
TECHNIQUE: Multidetector CT imaging of the chest was performed following the
standard protocol without IV contrast.

[Series 3: chest w/o · axial · non-contrast · 0.70mm/px · z∈[-244,+11]mm · 12 of 122 slices shown, 15 images]
[im 10/122  mediastinal]
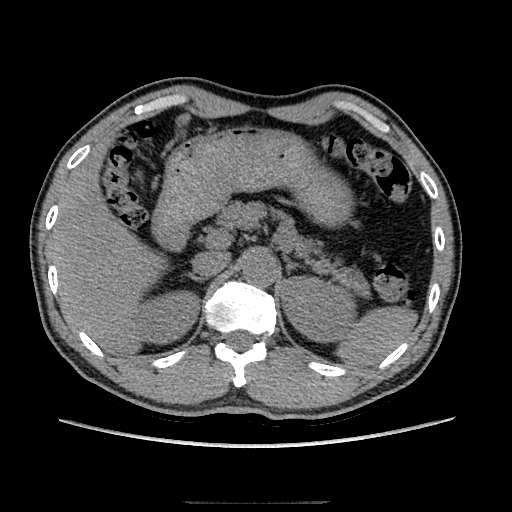
[im 10/122  lung]
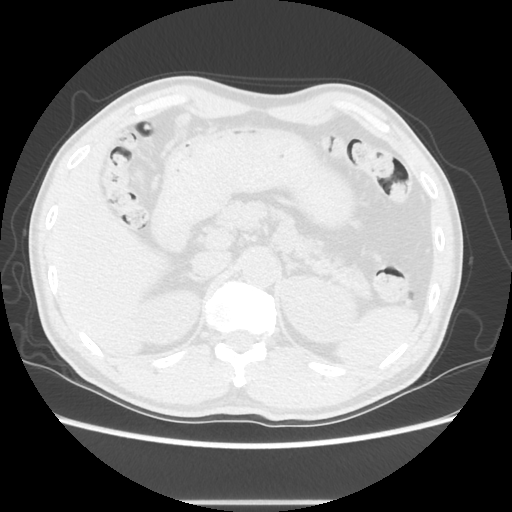
[im 19/122  lung]
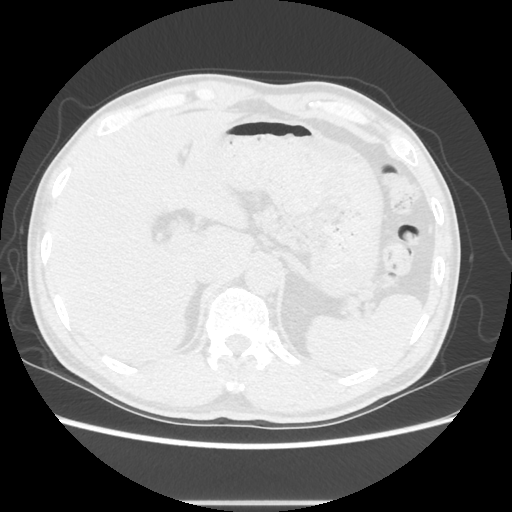
[im 28/122  lung]
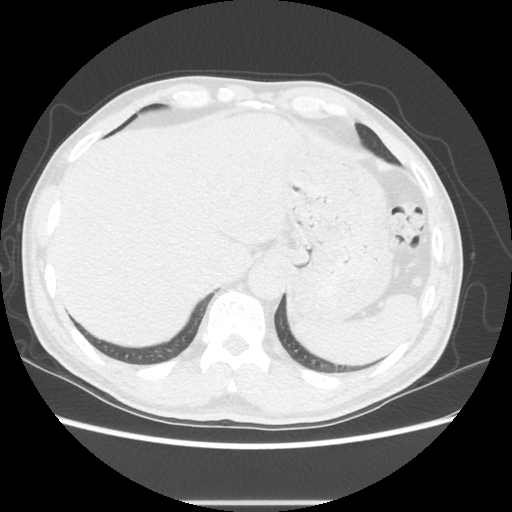
[im 38/122  lung]
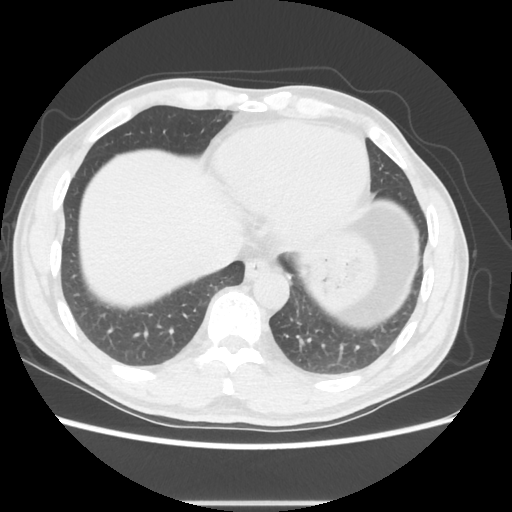
[im 47/122  mediastinal]
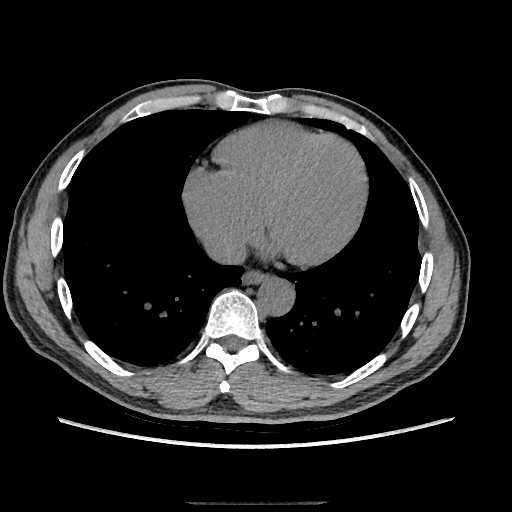
[im 47/122  lung]
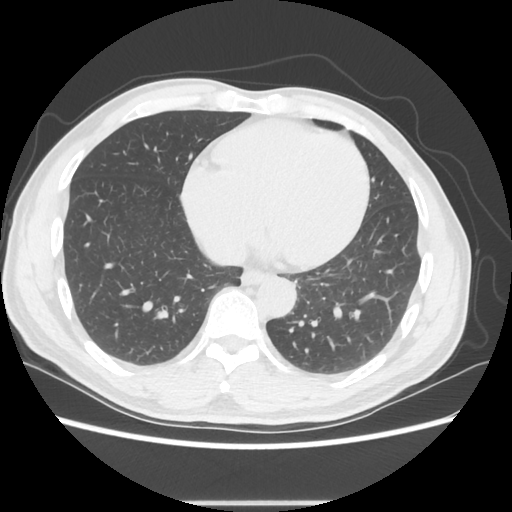
[im 56/122  lung]
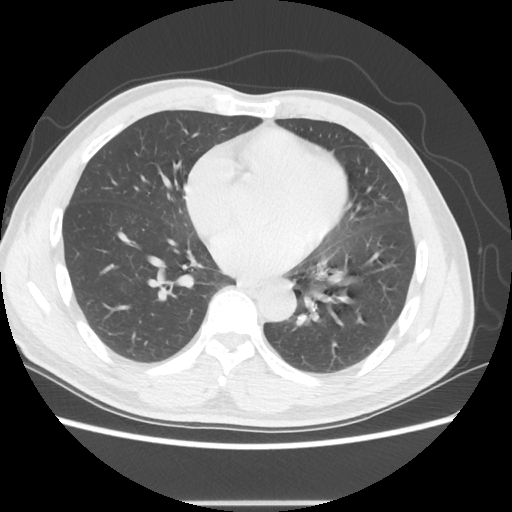
[im 66/122  lung]
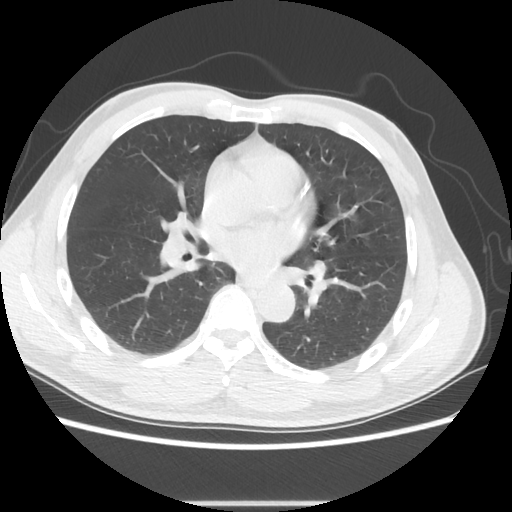
[im 75/122  lung]
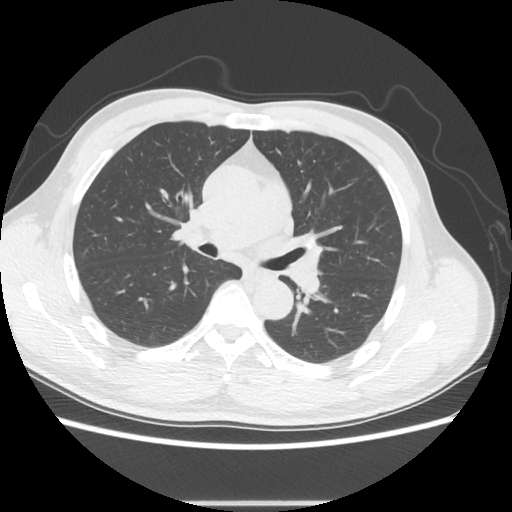
[im 84/122  mediastinal]
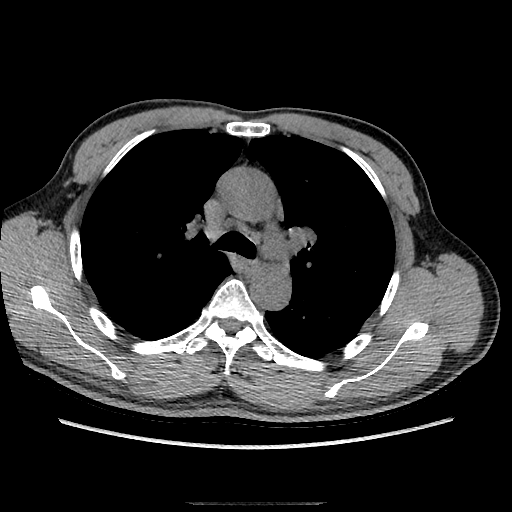
[im 84/122  lung]
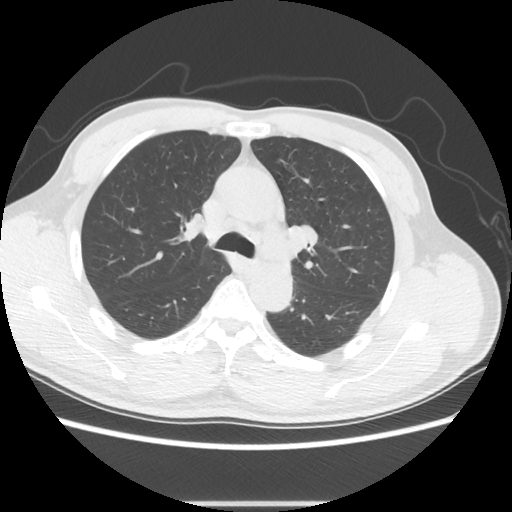
[im 94/122  lung]
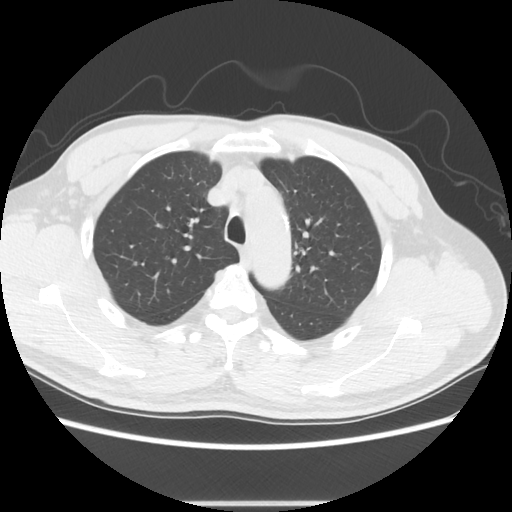
[im 103/122  lung]
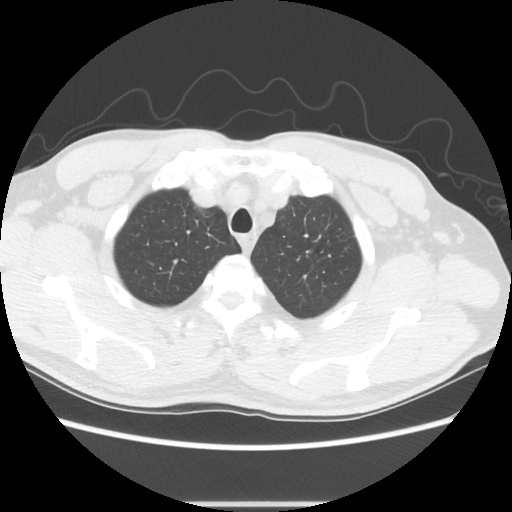
[im 112/122  lung]
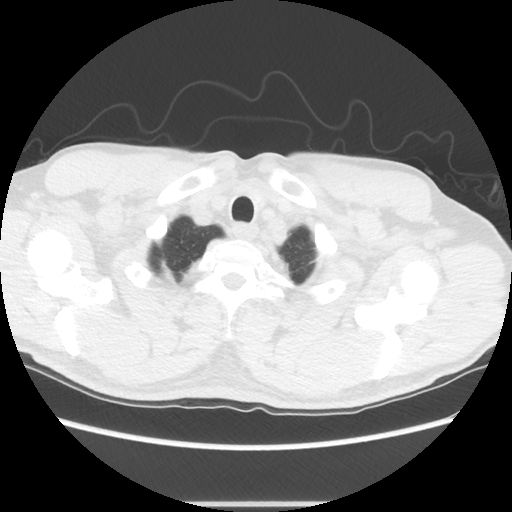

[12 of 31 positions shown; findings below may reference images not displayed]

FINDINGS: Cardiovascular: The heart size is normal. No pericardial effusion.
Coronary artery calcification is evident. Atherosclerotic
calcification is noted in the wall of the thoracic aorta.

Mediastinum/Nodes: Upper normal mediastinal lymph nodes are stable.
9 mm precarinal lymph node measured previously is unchanged at 9 mm.
The small hilar lymph nodes seen on the prior study are not well
demonstrated on today's noncontrast exam. The esophagus has normal
imaging features. There is no axillary lymphadenopathy.

Lungs/Pleura: 5 mm left upper lobe nodule on image 17 is unchanged.
6 x 10 mm left lower lobe nodule (image 73) is stable. No new
pulmonary nodule or mass. No focal airspace consolidation. No
pulmonary edema or pleural effusion.

Upper Abdomen: Unremarkable.

Musculoskeletal: Bone windows reveal no worrisome lytic or sclerotic
osseous lesions.
IMPRESSION: 1. Stable left lower lobe pulmonary nodule with calculated mean
diameter of 8 mm on prior study. Repeat CT at [DATE] is considered
optional for low-risk patients, but is recommended for high-risk
patients. This recommendation follows the consensus statement:
Guidelines for Management of Incidental Pulmonary Nodules Detected
2. Coronary artery and Aortic Atherosclerois (FRSWX-170.0)

## 2019-11-21 ENCOUNTER — Ambulatory Visit: Payer: Self-pay | Attending: Internal Medicine

## 2019-11-21 DIAGNOSIS — Z23 Encounter for immunization: Secondary | ICD-10-CM

## 2019-11-21 NOTE — Progress Notes (Signed)
   Covid-19 Vaccination Clinic  Name:  MAREK NGHIEM    MRN: [id number] DOB: 07/16/1961  11/21/2019  Mr. Bingman was observed post Covid-19 immunization for 15 minutes without incident. He was provided with Vaccine Information Sheet and instruction to access the V-Safe system.   Mr. Hume was instructed to call 911 with any severe reactions post vaccine: Marland Kitchen Difficulty breathing  . Swelling of face and throat  . A fast heartbeat  . A bad rash all over body  . Dizziness and weakness   Immunizations Administered    Name Date Dose VIS Date Route   Pfizer COVID-19 Vaccine 11/21/2019  1:59 PM 0.3 mL 08/21/2019 Intramuscular   Manufacturer: [employer], [employer]   Lot: [id number]   NDC: [address]-7

## 2019-12-15 ENCOUNTER — Ambulatory Visit: Payer: Self-pay | Attending: Internal Medicine

## 2019-12-15 DIAGNOSIS — Z23 Encounter for immunization: Secondary | ICD-10-CM

## 2023-03-20 ENCOUNTER — Encounter (HOSPITAL_BASED_OUTPATIENT_CLINIC_OR_DEPARTMENT_OTHER): Payer: Self-pay | Admitting: Emergency Medicine

## 2023-03-20 ENCOUNTER — Other Ambulatory Visit: Payer: Self-pay

## 2023-03-20 ENCOUNTER — Emergency Department (HOSPITAL_BASED_OUTPATIENT_CLINIC_OR_DEPARTMENT_OTHER): Payer: BC Managed Care – PPO | Admitting: Radiology

## 2023-03-20 ENCOUNTER — Emergency Department (HOSPITAL_BASED_OUTPATIENT_CLINIC_OR_DEPARTMENT_OTHER)
Admission: EM | Admit: 2023-03-20 | Discharge: 2023-03-20 | Disposition: A | Discharge status: Home / Self Care | Payer: BC Managed Care – PPO | Attending: Emergency Medicine | Admitting: Emergency Medicine

## 2023-03-20 DIAGNOSIS — S40012A Contusion of left shoulder, initial encounter: Secondary | ICD-10-CM | POA: Diagnosis not present

## 2023-03-20 DIAGNOSIS — Y92410 Unspecified street and highway as the place of occurrence of the external cause: Secondary | ICD-10-CM | POA: Diagnosis not present

## 2023-03-20 DIAGNOSIS — S0081XA Abrasion of other part of head, initial encounter: Secondary | ICD-10-CM | POA: Insufficient documentation

## 2023-03-20 DIAGNOSIS — S4992XA Unspecified injury of left shoulder and upper arm, initial encounter: Secondary | ICD-10-CM | POA: Diagnosis present

## 2023-03-20 MED ORDER — NAPROXEN 375 MG PO TABS: 375.0000 mg | ORAL_TABLET | Freq: Two times a day (BID) | ORAL | 0 refills | Status: AC | PRN
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
## 2023-03-20 NOTE — Discharge Instructions (Addendum)
As discussed, you did not break or dislocate any bones in the shoulder. You do have a shoulder contusion.  Take Naproxen twice a day for 10 days as needed.  This medication was sent to your pharmacy.  Utilize RICE therapy, instructions provided  Follow-up with your primary care provider in 1 week for reevaluation.  If you do not have a primary care provider there is a phone number discharge for work that you can call to help you get established with one.  Get help right away if: You have shortness of breath. You have light-headedness or you faint. You have chest pain. You have these eye or vision changes: Sudden vision loss or double vision. Your eye suddenly turns red. The black center of your eye (pupil) is an odd shape or size.
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
## 2024-01-11 ENCOUNTER — Other Ambulatory Visit: Payer: Self-pay

## 2024-01-11 ENCOUNTER — Emergency Department (HOSPITAL_BASED_OUTPATIENT_CLINIC_OR_DEPARTMENT_OTHER)
Admission: EM | Admit: 2024-01-11 | Discharge: 2024-01-11 | Disposition: A | Discharge status: Home / Self Care | Attending: Emergency Medicine | Admitting: Emergency Medicine

## 2024-01-11 DIAGNOSIS — S80211A Abrasion, right knee, initial encounter: Secondary | ICD-10-CM | POA: Diagnosis not present

## 2024-01-11 DIAGNOSIS — T310 Burns involving less than 10% of body surface: Secondary | ICD-10-CM | POA: Diagnosis not present

## 2024-01-11 DIAGNOSIS — T23271A Burn of second degree of right wrist, initial encounter: Secondary | ICD-10-CM | POA: Insufficient documentation

## 2024-01-11 DIAGNOSIS — S50311A Abrasion of right elbow, initial encounter: Secondary | ICD-10-CM | POA: Insufficient documentation

## 2024-01-11 DIAGNOSIS — X131XXA Other contact with steam and other hot vapors, initial encounter: Secondary | ICD-10-CM | POA: Insufficient documentation

## 2024-01-11 DIAGNOSIS — Y990 Civilian activity done for income or pay: Secondary | ICD-10-CM | POA: Insufficient documentation

## 2024-01-11 DIAGNOSIS — T300 Burn of unspecified body region, unspecified degree: Secondary | ICD-10-CM

## 2024-01-11 DIAGNOSIS — S8991XA Unspecified injury of right lower leg, initial encounter: Secondary | ICD-10-CM | POA: Diagnosis present

## 2024-01-11 DIAGNOSIS — Z23 Encounter for immunization: Secondary | ICD-10-CM | POA: Insufficient documentation

## 2024-01-11 DIAGNOSIS — Y9289 Other specified places as the place of occurrence of the external cause: Secondary | ICD-10-CM | POA: Insufficient documentation

## 2024-01-11 MED ORDER — BACITRACIN ZINC 500 UNIT/GM EX OINT: 1.0000 | TOPICAL_OINTMENT | Freq: Two times a day (BID) | CUTANEOUS | 0 refills | Status: AC

## 2024-01-11 MED ORDER — TETANUS-DIPHTH-ACELL PERTUSSIS 5-2.5-18.5 LF-MCG/0.5 IM SUSY
0.5000 mL | PREFILLED_SYRINGE | Freq: Once | INTRAMUSCULAR | Status: AC
  Administered 2024-01-11: 0.5 mL via INTRAMUSCULAR
  Filled 2024-01-11: qty 0.5

## 2024-01-11 MED ORDER — OXYCODONE HCL 5 MG PO TABS: 5.0000 mg | ORAL_TABLET | Freq: Four times a day (QID) | ORAL | 0 refills | Status: AC | PRN

## 2024-01-11 MED ORDER — OXYCODONE HCL 5 MG PO TABS
5.0000 mg | ORAL_TABLET | Freq: Once | ORAL | Status: AC
  Administered 2024-01-11: 5 mg via ORAL
  Filled 2024-01-11: qty 1
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
## 2024-01-11 NOTE — ED Notes (Signed)
 Pt given discharge instructions and reviewed prescriptions. Opportunities given for questions. Pt verbalizes understanding. Jillyn Hidden, RN
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
